# Patient Record
Sex: Female | Born: 1960 | Race: White | Hispanic: No | Marital: Married | Smoking: Former smoker
Health system: Southern US, Community
[De-identification: ages and names within clinical notes are randomized; demographics above are authoritative.]

## PROBLEM LIST (undated history)

## (undated) DIAGNOSIS — E1140 Type 2 diabetes mellitus with diabetic neuropathy, unspecified: Secondary | ICD-10-CM

## (undated) DIAGNOSIS — R112 Nausea with vomiting, unspecified: Secondary | ICD-10-CM

## (undated) DIAGNOSIS — R002 Palpitations: Secondary | ICD-10-CM

## (undated) DIAGNOSIS — R42 Dizziness and giddiness: Secondary | ICD-10-CM

## (undated) DIAGNOSIS — I10 Essential (primary) hypertension: Secondary | ICD-10-CM

## (undated) DIAGNOSIS — Z9889 Other specified postprocedural states: Secondary | ICD-10-CM

## (undated) DIAGNOSIS — E119 Type 2 diabetes mellitus without complications: Secondary | ICD-10-CM

## (undated) DIAGNOSIS — I809 Phlebitis and thrombophlebitis of unspecified site: Secondary | ICD-10-CM

## (undated) DIAGNOSIS — M545 Low back pain, unspecified: Secondary | ICD-10-CM

## (undated) HISTORY — DX: Type 2 diabetes mellitus without complications: E11.9

## (undated) HISTORY — PX: BLADDER SURGERY: SHX569

## (undated) HISTORY — PX: TONSILLECTOMY: SUR1361

## (undated) HISTORY — PX: TOTAL ABDOMINAL HYSTERECTOMY: SHX209

---

## 1993-08-02 HISTORY — PX: TUBAL LIGATION: SHX77

## 2004-05-20 ENCOUNTER — Emergency Department: Payer: Self-pay | Admitting: Emergency Medicine

## 2004-11-10 ENCOUNTER — Ambulatory Visit: Payer: Self-pay | Admitting: Obstetrics and Gynecology

## 2004-11-20 ENCOUNTER — Ambulatory Visit: Payer: Self-pay | Admitting: Family Medicine

## 2005-12-09 ENCOUNTER — Ambulatory Visit: Payer: Self-pay | Admitting: Obstetrics and Gynecology

## 2006-12-13 ENCOUNTER — Ambulatory Visit: Payer: Self-pay | Admitting: Obstetrics and Gynecology

## 2007-10-31 ENCOUNTER — Ambulatory Visit: Payer: Self-pay | Admitting: Obstetrics and Gynecology

## 2007-11-06 ENCOUNTER — Inpatient Hospital Stay: Payer: Self-pay | Admitting: Obstetrics and Gynecology

## 2007-11-14 ENCOUNTER — Ambulatory Visit: Payer: Self-pay | Admitting: Obstetrics and Gynecology

## 2007-12-18 ENCOUNTER — Ambulatory Visit: Payer: Self-pay | Admitting: Obstetrics and Gynecology

## 2008-12-19 ENCOUNTER — Ambulatory Visit: Payer: Self-pay | Admitting: Obstetrics and Gynecology

## 2009-12-22 ENCOUNTER — Ambulatory Visit: Payer: Self-pay | Admitting: Obstetrics and Gynecology

## 2010-07-01 ENCOUNTER — Ambulatory Visit: Payer: Self-pay | Admitting: Internal Medicine

## 2010-08-24 ENCOUNTER — Ambulatory Visit: Payer: Self-pay | Admitting: Urology

## 2010-12-25 ENCOUNTER — Ambulatory Visit: Payer: Self-pay | Admitting: Obstetrics and Gynecology

## 2011-03-26 ENCOUNTER — Ambulatory Visit: Payer: Self-pay | Admitting: Family Medicine

## 2011-12-28 ENCOUNTER — Ambulatory Visit: Payer: Self-pay | Admitting: Obstetrics and Gynecology

## 2012-04-11 ENCOUNTER — Ambulatory Visit: Payer: Self-pay | Admitting: Obstetrics and Gynecology

## 2012-04-11 LAB — URINALYSIS, COMPLETE
Bilirubin,UR: NEGATIVE
Ketone: NEGATIVE
Nitrite: NEGATIVE
Ph: 7 (ref 4.5–8.0)
Protein: NEGATIVE
RBC,UR: <1 /HPF (ref 0–5)
Specific Gravity: 1.012 (ref 1.003–1.030)
Squamous Epithelial: 2
WBC UR: <1 /HPF (ref 0–5)

## 2012-04-17 ENCOUNTER — Ambulatory Visit: Payer: Self-pay | Admitting: Urology

## 2013-05-10 ENCOUNTER — Encounter: Payer: Self-pay | Admitting: Podiatry

## 2013-05-14 ENCOUNTER — Ambulatory Visit (INDEPENDENT_AMBULATORY_CARE_PROVIDER_SITE_OTHER): Payer: BC Managed Care – PPO

## 2013-05-14 ENCOUNTER — Ambulatory Visit (INDEPENDENT_AMBULATORY_CARE_PROVIDER_SITE_OTHER): Payer: BC Managed Care – PPO | Admitting: Podiatry

## 2013-05-14 ENCOUNTER — Encounter: Payer: Self-pay | Admitting: Podiatry

## 2013-05-14 VITALS — BP 119/70 | HR 69 | Resp 16 | Ht 67.0 in | Wt 194.0 lb

## 2013-05-14 DIAGNOSIS — L02619 Cutaneous abscess of unspecified foot: Secondary | ICD-10-CM | POA: Insufficient documentation

## 2013-05-14 DIAGNOSIS — L97509 Non-pressure chronic ulcer of other part of unspecified foot with unspecified severity: Secondary | ICD-10-CM | POA: Insufficient documentation

## 2013-05-14 MED ORDER — MUPIROCIN 2 % EX OINT: TOPICAL_OINTMENT | Freq: Three times a day (TID) | CUTANEOUS | Status: DC

## 2013-05-14 MED ORDER — LEVOFLOXACIN 500 MG PO TABS: 500.0000 mg | ORAL_TABLET | Freq: Every day | ORAL | Status: DC

## 2013-05-14 NOTE — Progress Notes (Signed)
Cynthia King presents today with a painful sub-first metatarsophalangeal joint lesion. She is a known history of ulceration here. Denies fever chills nausea vomiting muscle aches pain. States that has been going on now for the past few weeks. She is eager to get back to walking. No change in her past medical history medications and allergies.  Objective: Pulses remain strongly palpable bilateral lower extremity. Slight decrease in sensorium per Semmes-Weinstein monofilament. She has a superficial ulceration plantar aspect of the right foot after debridement of reactive hyperkeratosis. Some mild erythema surrounding the area. There is some mild serosanguineous drainage. Radiographs taken today do not demonstrate any type of osseous abnormalities. However I remain concerned for for an acute osteomyelitis of the tibial sesamoid.  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 Ulceration less than 3 mm. Mild cellulitis. Questionable osteomyelitis.  Plan: She will continue her conservative therapies as far as soaking this and applying Acrobat ointment to it daily after soaking. She'll also dress the foot after soaking and application of the ointment. I am starting her on Levaquin 500 mg 1 by mouth daily. We'll followup with her in 2 weeks.

## 2013-05-28 ENCOUNTER — Ambulatory Visit (INDEPENDENT_AMBULATORY_CARE_PROVIDER_SITE_OTHER): Payer: BC Managed Care – PPO | Admitting: Podiatry

## 2013-05-28 ENCOUNTER — Encounter: Payer: Self-pay | Admitting: Podiatry

## 2013-05-28 VITALS — BP 121/78 | HR 79 | Temp 97.9°F | Resp 16 | Ht 67.0 in

## 2013-05-28 DIAGNOSIS — L97509 Non-pressure chronic ulcer of other part of unspecified foot with unspecified severity: Secondary | ICD-10-CM

## 2013-05-29 NOTE — Progress Notes (Signed)
Cynthia King presents today for followup of ulceration sub-first metatarsophalangeal joint of her right foot. She states it is doing great. She continues to placed padding and limit her activity. She denies fever chills nausea vomiting muscle aches or pains. States that her blood sugar is within normal limits.  Objective: Vital signs are stable she is alert and oriented x3 pulses are strongly palpable to the bilateral lower extremity. Reactive hyperkeratosis is present sub-first metatarsophalangeal joint. Once debrided reveals no signs of ulceration at this point. There is no signs of infection. I debrided all of the reactive hyperkeratosis to normal tissue.  Assessment: Well-healing ulcerative lesion plantar aspect right foot.  Plan: We discussed etiology pathology conservative versus surgical therapies we discussed padding and orthotics. Debrided her lesion today we'll followup with her in December for routine debridement.

## 2013-07-23 ENCOUNTER — Encounter: Payer: Self-pay | Admitting: Podiatry

## 2013-07-23 ENCOUNTER — Ambulatory Visit (INDEPENDENT_AMBULATORY_CARE_PROVIDER_SITE_OTHER): Payer: BC Managed Care – PPO | Admitting: Podiatry

## 2013-07-23 VITALS — BP 139/84 | HR 69 | Resp 16

## 2013-07-23 DIAGNOSIS — L97509 Non-pressure chronic ulcer of other part of unspecified foot with unspecified severity: Secondary | ICD-10-CM

## 2013-07-23 DIAGNOSIS — E1169 Type 2 diabetes mellitus with other specified complication: Secondary | ICD-10-CM

## 2013-07-23 NOTE — Progress Notes (Signed)
   Subjective:    Patient ID: Cynthia King, female    DOB: [birth year], 52 y.o.   MRN: [id number]  HPI Comments: " its better "     Review of Systems     Objective:   Physical Exam: Pulses remain strongly palpable right lower extremity. Reactive hyperkeratosis plantar aspect of the right foot previously a diabetic ulcer has resolved and was debrided today demonstrates no bleeding.        Assessment & Plan:  Assessment: Well-healing diabetic ulceration plantar aspect of the right first metatarsophalangeal joint.  Plan: Debridement reactive hyperkeratosis and ulceration plantar aspect of the right foot today followup with her in approximately 2 months.

## 2013-09-24 ENCOUNTER — Encounter: Payer: Self-pay | Admitting: Podiatry

## 2013-09-24 ENCOUNTER — Ambulatory Visit (INDEPENDENT_AMBULATORY_CARE_PROVIDER_SITE_OTHER): Payer: BC Managed Care – PPO | Admitting: Podiatry

## 2013-09-24 VITALS — BP 111/68 | HR 80 | Resp 16 | Ht 67.0 in | Wt 195.0 lb

## 2013-09-24 DIAGNOSIS — L97509 Non-pressure chronic ulcer of other part of unspecified foot with unspecified severity: Secondary | ICD-10-CM

## 2013-09-24 DIAGNOSIS — Q828 Other specified congenital malformations of skin: Secondary | ICD-10-CM

## 2013-09-24 DIAGNOSIS — E1169 Type 2 diabetes mellitus with other specified complication: Secondary | ICD-10-CM

## 2013-09-24 NOTE — Progress Notes (Signed)
Cynthia King presents today for followup of her ulcerations plantar aspect of the bilateral foot. She states that the right forefoot sub-first metatarsophalangeal joint is mildly tender.  Objective: Vital signs are stable she is alert and oriented x3. Pulses are palpable bilateral. Capillary fill time is immediate to toes one through 5 bilateral. Reactive hyperkeratosis sub-first metatarsophalangeal joint bilaterally and the IP joint of the hallux bilaterally. He reactive hyperkeratosis sub-first metatarsophalangeal joint of the right foot is moderately tender with no purulence noted. There is no erythema edema saline is drainage or odor.  Assessment: Diabetes mellitus with a history of diabetic ulceration and diabetic peripheral neuropathy. Superficial ulceration and reactive hyperkeratosis sub-first metatarsophalangeal joint and hallux interphalangeal joint bilateral.  Plan: Debridement of all reactive hyperkeratosis.

## 2013-11-13 ENCOUNTER — Ambulatory Visit (INDEPENDENT_AMBULATORY_CARE_PROVIDER_SITE_OTHER): Payer: BC Managed Care – PPO | Admitting: Podiatry

## 2013-11-13 VITALS — Resp 16 | Ht 67.0 in | Wt 200.0 lb

## 2013-11-13 DIAGNOSIS — L97509 Non-pressure chronic ulcer of other part of unspecified foot with unspecified severity: Secondary | ICD-10-CM

## 2013-11-14 NOTE — Progress Notes (Signed)
Subjective:     Patient ID: Cynthia King, female   DOB: [birth year], 53 y.o.   MRN: [id number]  HPI patient states I need this area trimmed underneath my right foot. Points to the right first metatarsal stating it has been thick and irritated   Review of Systems     Objective:   Physical Exam Neurovascular status unchanged from previous visit with keratotic tissue sub-first metatarsal right with slight superficial breakdown but no proximal edema erythema or drainage noted    Assessment:     Superficial ulceration with neuropathy as part of the problem for this patient    Plan:     Debridement of tissue did not no drainage or anything to culture flushed and clean the area applied Iodosorb and sterile dressing with padding reappoint for routine care and less any issues should occur

## 2013-11-27 ENCOUNTER — Ambulatory Visit: Payer: Self-pay | Admitting: Gastroenterology

## 2013-12-10 ENCOUNTER — Other Ambulatory Visit: Payer: Self-pay | Admitting: *Deleted

## 2013-12-10 ENCOUNTER — Ambulatory Visit (INDEPENDENT_AMBULATORY_CARE_PROVIDER_SITE_OTHER): Payer: BC Managed Care – PPO

## 2013-12-10 ENCOUNTER — Encounter: Payer: Self-pay | Admitting: Podiatry

## 2013-12-10 ENCOUNTER — Ambulatory Visit (INDEPENDENT_AMBULATORY_CARE_PROVIDER_SITE_OTHER): Payer: BC Managed Care – PPO | Admitting: Podiatry

## 2013-12-10 VITALS — BP 128/82 | HR 70 | Temp 98.6°F | Resp 16

## 2013-12-10 DIAGNOSIS — L97509 Non-pressure chronic ulcer of other part of unspecified foot with unspecified severity: Secondary | ICD-10-CM

## 2013-12-10 MED ORDER — AMOXICILLIN-POT CLAVULANATE 875-125 MG PO TABS: 1.0000 | ORAL_TABLET | Freq: Two times a day (BID) | ORAL | Status: DC

## 2013-12-10 NOTE — Progress Notes (Signed)
She presents today chief complaint of a painful left foot with some swelling and some irritation around her hallux left. She is diabetic and is concerned about the left hallux. She states it is become more painful and more swollen over the past few days.  Objective: Vital signs are stable she is alert and oriented x3 pulses are palpable bilateral foot. Reactive hyperkeratosis to the plantar aspect of the first metatarsophalangeal joint as well as the plantar aspect of the hallux left. There is a superficial ulceration with skin breakdown beneath the IP joint of the hallux left with overlying reactive hyperkeratosis that is painful palpation. Cellulitic process with mild odor from the toe. Radiographic evaluation does not demonstrate any type of osteomyelitis of the hallux left. There is no gas in the skin.  Assessment: Diabetic ulceration plantar aspect first metatarsophalangeal joint right and hallux left with cellulitis left hallux.  Plan: Discussed etiology pathology conservative versus surgical therapies. I sharply debrided both wounds today. Dressed the left great toe with Silvadene and a dry sterile compressive dressing. She will start soaking twice daily Epsom salts and water and apply Bactroban ointment dressing. She was dispensed a prescription for Augmentin 875 mg #20 one by mouth twice a day. She was also dispensed a Darco shoe and we will followup with her in 2 weeks.

## 2013-12-19 ENCOUNTER — Ambulatory Visit: Payer: BC Managed Care – PPO | Admitting: Podiatry

## 2013-12-27 ENCOUNTER — Ambulatory Visit (INDEPENDENT_AMBULATORY_CARE_PROVIDER_SITE_OTHER): Payer: BC Managed Care – PPO | Admitting: Podiatrist

## 2013-12-27 ENCOUNTER — Encounter: Payer: Self-pay | Admitting: Podiatrist

## 2013-12-27 VITALS — BP 122/76 | HR 68 | Resp 16

## 2013-12-27 DIAGNOSIS — L97509 Non-pressure chronic ulcer of other part of unspecified foot with unspecified severity: Secondary | ICD-10-CM

## 2013-12-27 DIAGNOSIS — E1169 Type 2 diabetes mellitus with other specified complication: Secondary | ICD-10-CM

## 2013-12-27 NOTE — Progress Notes (Signed)
Subjective: Patient presents today for followup of ulceration plantar aspect of the left hallux. She states she's been following all instructions and that the ulceration itself looks much improved. She has been wearing the Darco shoe as instructed. She denies any nausea, vomiting, local or systemic signs of infection.  She also relates that she has had problems with ulcerations in the past and she is neuropathic and has no sensation to the plantar aspect of both of her feet.  Objective: Vascular status shows palpable pedal pulses at 2/4 DP and PT bilateral. Neurological sensation continues to be diminished bilateral. The previous ulceration on the plantar aspect of the left hallux appears to be improved.. There is still some friable fragile and hemorrhagic tissue present. No redness, no swelling, no pus, no signs of infection are present.  Assessment: Healing ulceration left hallux  Plan: Recommended that she redressed the toe with a dry and protective dressing for one more week. She'll to wear the Darco shoe for one more week as well. She'll be seen back with Dr. Al Corpus for a recheck in the future and for continued debridement of the hyperkeratotic lesions. If she has any problems or concerns prior that visit she is instructed to call.

## 2014-01-11 ENCOUNTER — Ambulatory Visit (INDEPENDENT_AMBULATORY_CARE_PROVIDER_SITE_OTHER): Payer: BC Managed Care – PPO | Admitting: Podiatry

## 2014-01-11 ENCOUNTER — Ambulatory Visit (INDEPENDENT_AMBULATORY_CARE_PROVIDER_SITE_OTHER): Payer: BC Managed Care – PPO

## 2014-01-11 VITALS — BP 134/79 | HR 77 | Resp 16

## 2014-01-11 DIAGNOSIS — L02619 Cutaneous abscess of unspecified foot: Secondary | ICD-10-CM

## 2014-01-11 DIAGNOSIS — L03119 Cellulitis of unspecified part of limb: Principal | ICD-10-CM

## 2014-01-11 DIAGNOSIS — L97509 Non-pressure chronic ulcer of other part of unspecified foot with unspecified severity: Secondary | ICD-10-CM

## 2014-01-11 MED ORDER — AMOXICILLIN-POT CLAVULANATE 875-125 MG PO TABS: 1.0000 | ORAL_TABLET | Freq: Two times a day (BID) | ORAL | Status: DC

## 2014-01-11 NOTE — Progress Notes (Signed)
Subjective:     Patient ID: Cynthia King, female   DOB: 10/10/1960, 53 y.o.   MRN: [id number]  HPI patient presents with a keratotic lesion underneath the first metatarsal head right that she states has become painful in the last several days. She was on antibiotics but finished it about 3 weeks ago   Review of Systems     Objective:   Physical Exam Neurovascular system is intact with no occasions a systemic infection with normal temperature and no history of chills or fever. Patient is found to have a keratotic lesion underneath the right first metatarsal head that is thick with mild redness around it and pain when I pressed    Assessment:     Probable soft tissue infection right first metatarsal head secondary to keratotic tissue that buildup    Plan:     Debride the keratotic tissue and I found there to be a small amount of drainage which I opened and cultured. Advised on soaks applied pad and antibiotic solution and placed patient on Augmentin 875 twice a day. If she should develop any proximal redness pain indications of systemic infection she is to go straight to the emergency room and will be seen back for recheck in the next several weeks

## 2014-01-22 DIAGNOSIS — M79609 Pain in unspecified limb: Secondary | ICD-10-CM

## 2014-01-23 ENCOUNTER — Encounter: Payer: Self-pay | Admitting: Podiatry

## 2014-01-23 ENCOUNTER — Ambulatory Visit (INDEPENDENT_AMBULATORY_CARE_PROVIDER_SITE_OTHER): Payer: BC Managed Care – PPO | Admitting: Podiatry

## 2014-01-23 DIAGNOSIS — L97509 Non-pressure chronic ulcer of other part of unspecified foot with unspecified severity: Secondary | ICD-10-CM

## 2014-01-23 DIAGNOSIS — E1169 Type 2 diabetes mellitus with other specified complication: Secondary | ICD-10-CM

## 2014-01-23 NOTE — Progress Notes (Signed)
She presents today for followup of a chronic ulceration sub-first metatarsal head of the right foot. She states is becoming little more sore.  Objective: Pulses are palpable right foot. No erythema edema saline is drainage or odor reactive hyperkeratosis is positive sub-first metatarsophalangeal joint right foot. Otherwise the foot looks good.  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 Healed currently.  Plan: Debridement of wound today followup with me in 2 months.

## 2014-01-27 IMAGING — MG MAM DGTL SCRN MAM NO ORDER W/CAD
1 series · 4 of 4 positions shown · non-contrast
Comparison: none

REASON FOR EXAM: scr mammo no order
COMMENTS:

[R CC · right · 4 of 4 slices shown]
[im 1/4]
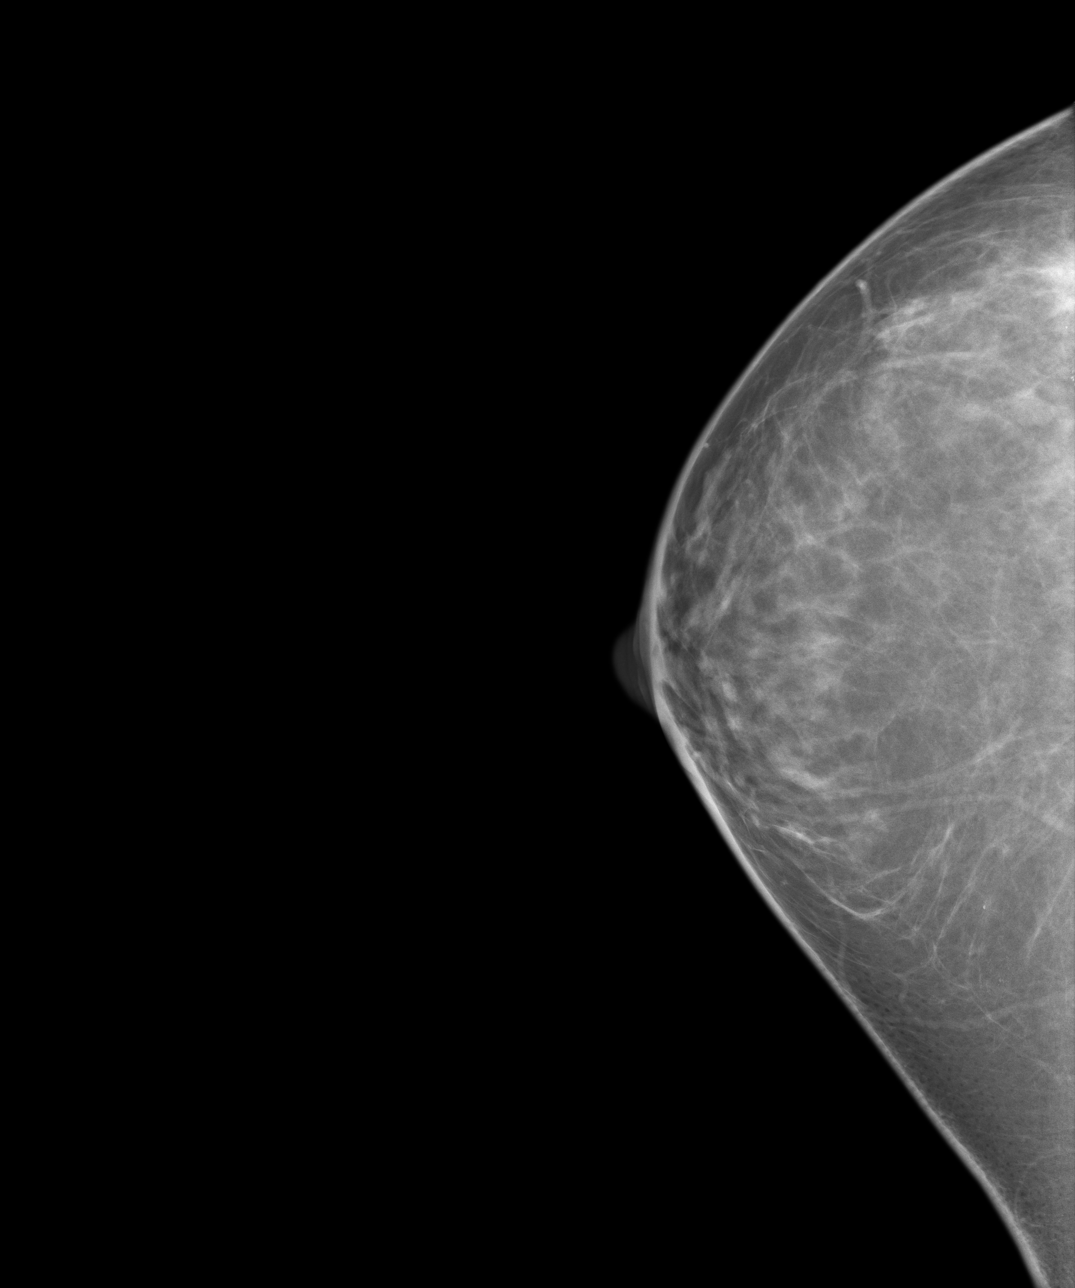
[im 2/4]
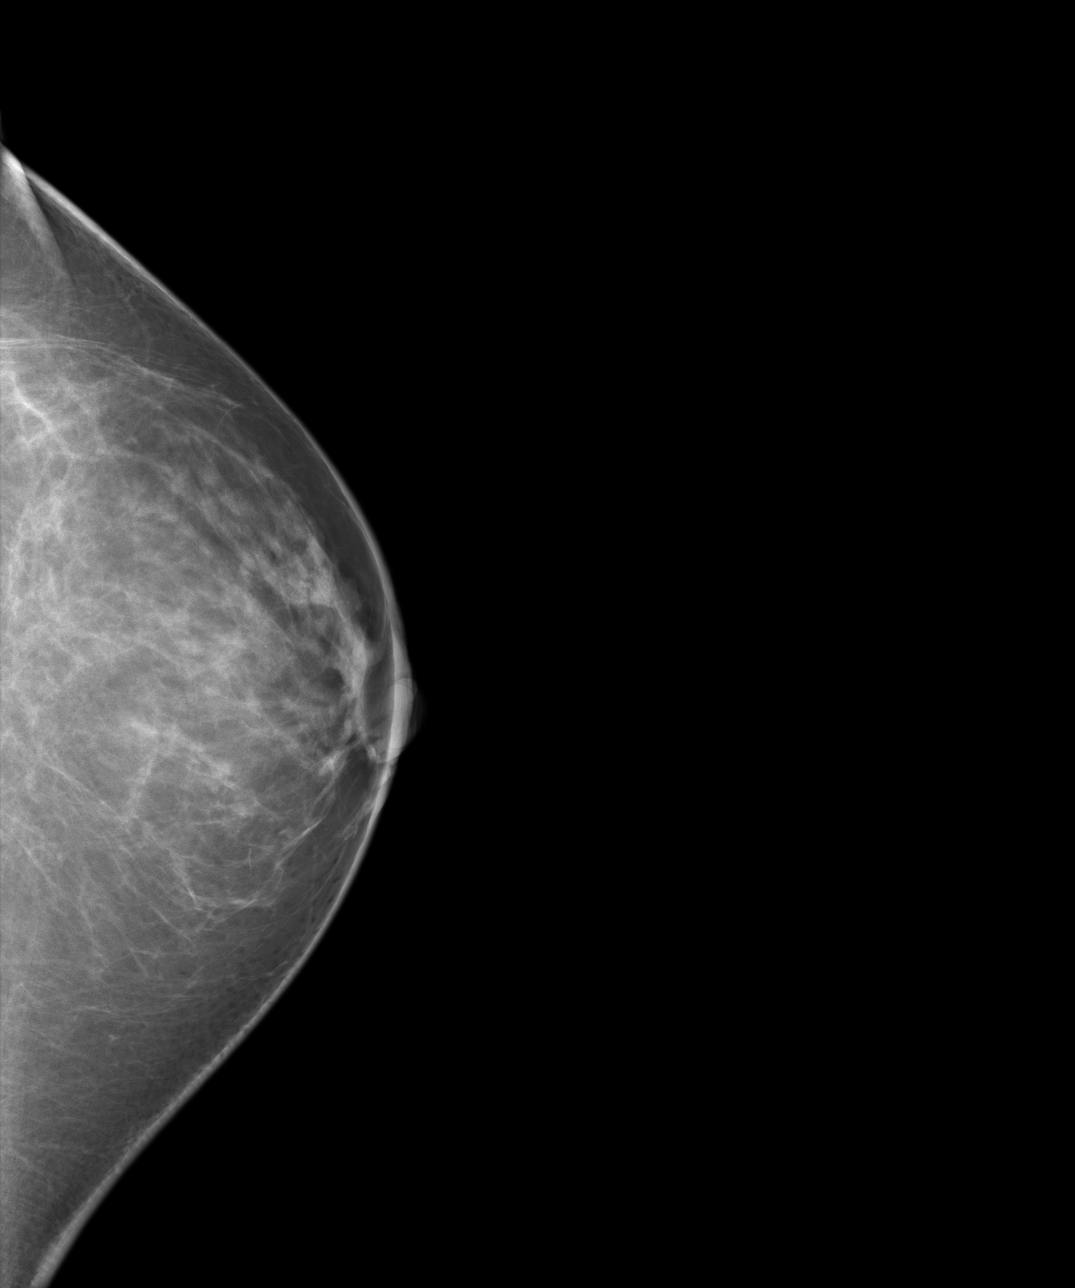
[im 3/4]
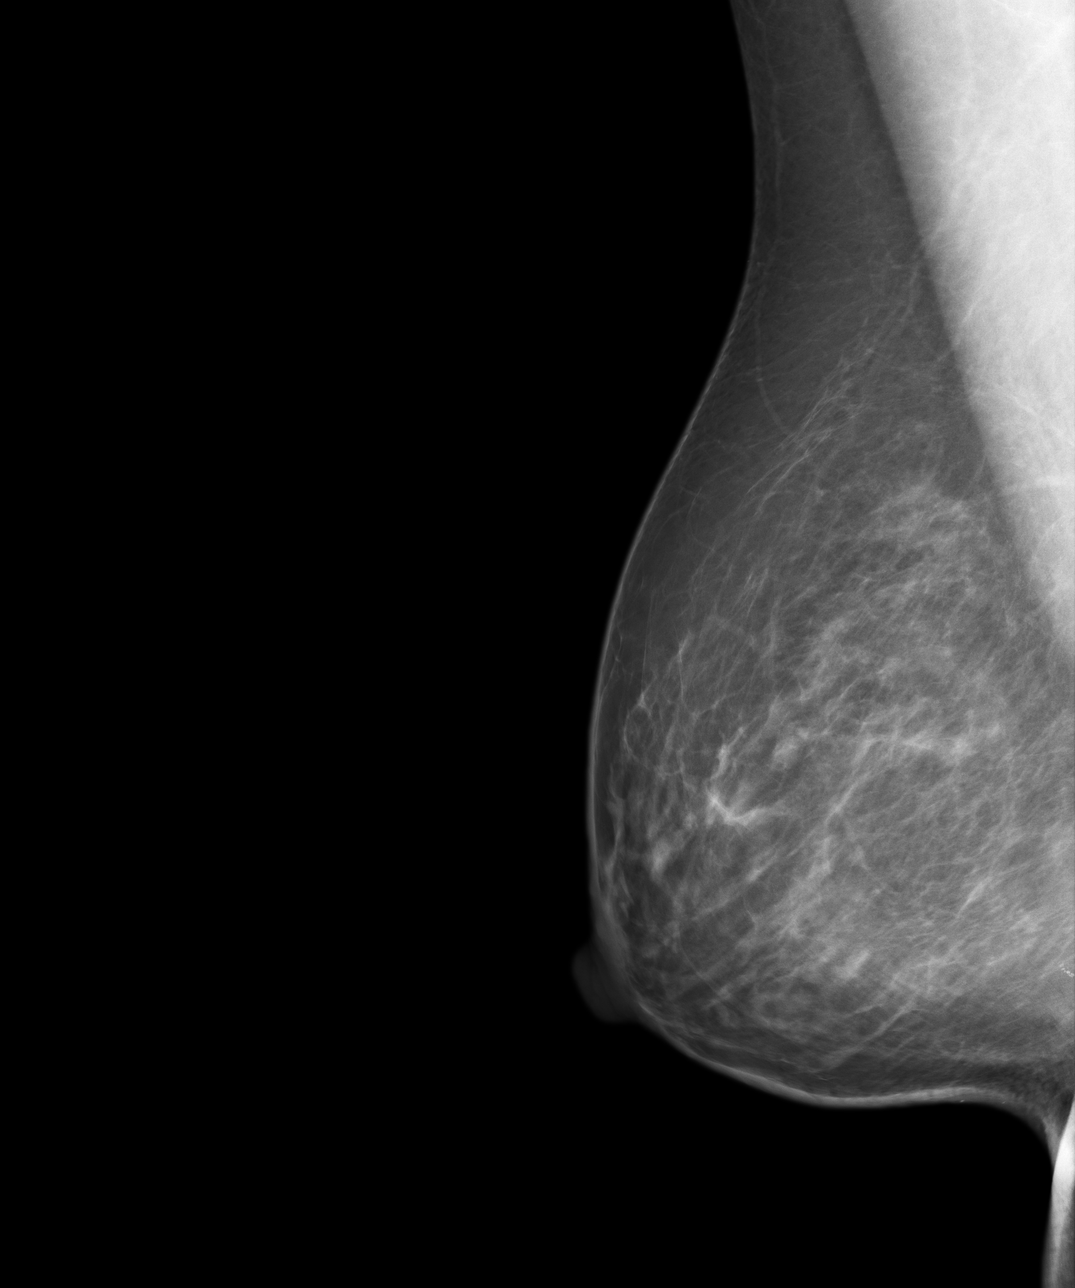
[im 4/4]
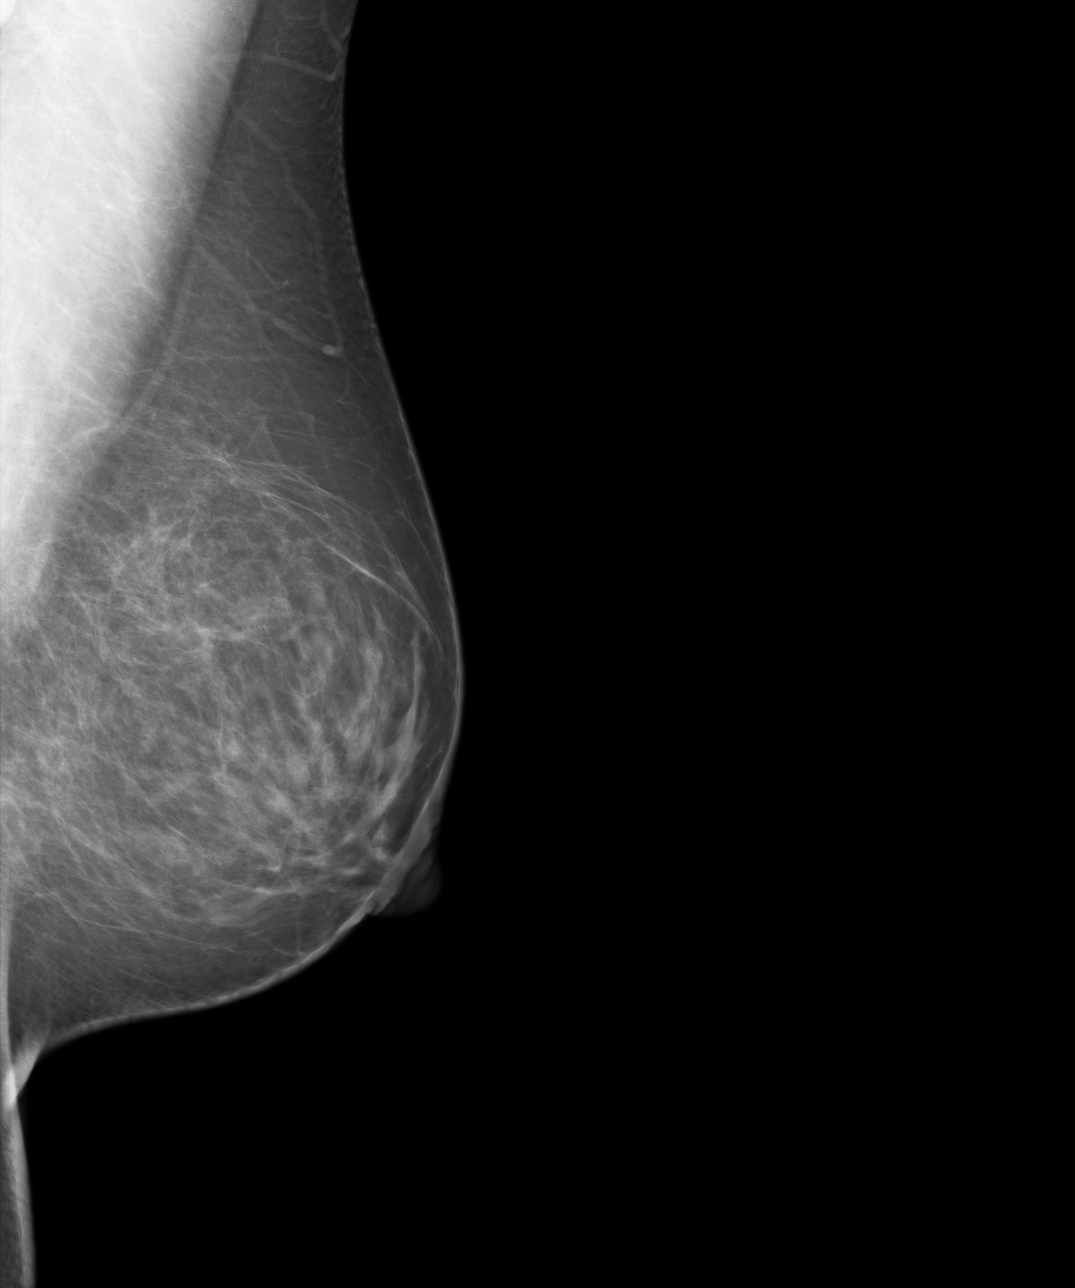

[4 of 4 positions shown; findings below may reference images not displayed]

PROCEDURE:     MAM - MAM DGTL SCRN MAM NO ORDER W/CAD  - December 28, 2011  [DATE]

RESULT:       Comparison is made to prior studies dated 12/25/2010 and
12/22/2009 and unilateral right mammogram dated 04/28/2001.

The breasts demonstrate a mild heterogeneous parenchymal pattern.  There is
no radiographic evidence to suggest malignancy.
IMPRESSION: BI-RADS: Category 2- Benign Finding.

Thank you for the opportunity to contribute to the care of your patient.

A NEGATIVE MAMMOGRAM REPORT DOES NOT PRECLUDE BIOPSY OR OTHER EVALUATION OF
A CLINICALLY PALPABLE OR OTHERWISE SUSPICIOUS MASS OR LESION.  BREAST CANCER
MAY NOT BE DETECTED BY MAMMOGRAPHY IN UP TO 10% OF CASES.

## 2014-03-27 ENCOUNTER — Ambulatory Visit: Payer: BC Managed Care – PPO | Admitting: Podiatry

## 2014-05-06 ENCOUNTER — Ambulatory Visit (INDEPENDENT_AMBULATORY_CARE_PROVIDER_SITE_OTHER): Payer: BC Managed Care – PPO | Admitting: Podiatry

## 2014-05-06 DIAGNOSIS — Q828 Other specified congenital malformations of skin: Secondary | ICD-10-CM

## 2014-05-06 DIAGNOSIS — L97519 Non-pressure chronic ulcer of other part of right foot with unspecified severity: Secondary | ICD-10-CM

## 2014-05-06 DIAGNOSIS — E10621 Type 1 diabetes mellitus with foot ulcer: Secondary | ICD-10-CM

## 2014-05-06 NOTE — Progress Notes (Signed)
She presents today for followup of her diabetic foot condition. She states that she seems to be doing quite well however of the ulceration is developed into a very painful callus.  Objective: Vital signs are stable she is alert and oriented x3. Pulses are palpable DP and PT bilateral. Reactive porokeratotic lesion plantar aspect right sub-first metatarsophalangeal joint. No erythema edema cellulitis drainage or odor. Reactive hyperkeratotic tissue IP joint of the hallux bilateral. No signs of ulceration or infection.  Assessment: Diabetes of porokeratotic lesion plantar aspect of the first metatarsophalangeal joint of the right foot.  Plan: Debridement reactive hypertrophic keratotic lesion today and I will followup with her as needed.

## 2014-06-19 ENCOUNTER — Ambulatory Visit: Payer: Self-pay | Admitting: Obstetrics and Gynecology

## 2014-07-15 ENCOUNTER — Ambulatory Visit (INDEPENDENT_AMBULATORY_CARE_PROVIDER_SITE_OTHER): Payer: BC Managed Care – PPO | Admitting: Podiatry

## 2014-07-15 ENCOUNTER — Ambulatory Visit (INDEPENDENT_AMBULATORY_CARE_PROVIDER_SITE_OTHER): Payer: BC Managed Care – PPO

## 2014-07-15 VITALS — BP 127/88 | HR 74 | Resp 16

## 2014-07-15 DIAGNOSIS — E10621 Type 1 diabetes mellitus with foot ulcer: Secondary | ICD-10-CM

## 2014-07-15 DIAGNOSIS — Q828 Other specified congenital malformations of skin: Secondary | ICD-10-CM

## 2014-07-15 DIAGNOSIS — M722 Plantar fascial fibromatosis: Secondary | ICD-10-CM

## 2014-07-15 DIAGNOSIS — L97519 Non-pressure chronic ulcer of other part of right foot with unspecified severity: Secondary | ICD-10-CM

## 2014-07-15 NOTE — Progress Notes (Signed)
She presents today with chief complaint of painful ulceration plantar aspect of the first metatarsophalangeal joint right foot. Painful right heel. She states that she likes having  Orthotics.  Objective: Vital signs are stable she is alert and oriented 3. Pain on palpation medially continued to the right heel. Radiographs do demonstrate soft tissue increase in density on a fascial insertion site. Upon debridement of reactive hyperkeratosis of first metatarsophalangeal joint of the right foot demonstrates a painful non-infected ulceration right first metatarsophalangeal joint.  Assessment: Diabetes with a history of diabetic ulceration right. Plantar fasciitis.  Plan: Injected the right heel today with Kenalog and local anesthetic. She was also scanned for set of orthotics. I also dispensed a metatarsal brace and night splint. I will follow up with her in the near future.

## 2014-07-17 ENCOUNTER — Ambulatory Visit: Payer: BC Managed Care – PPO | Admitting: Podiatry

## 2014-08-05 ENCOUNTER — Ambulatory Visit: Payer: BC Managed Care – PPO | Admitting: Podiatry

## 2014-08-16 ENCOUNTER — Encounter: Payer: Self-pay | Admitting: Podiatry

## 2014-09-16 ENCOUNTER — Ambulatory Visit: Payer: BC Managed Care – PPO | Admitting: Podiatry

## 2014-09-23 ENCOUNTER — Ambulatory Visit (INDEPENDENT_AMBULATORY_CARE_PROVIDER_SITE_OTHER): Payer: No Typology Code available for payment source | Admitting: Podiatry

## 2014-09-23 ENCOUNTER — Encounter: Payer: Self-pay | Admitting: Podiatry

## 2014-09-23 VITALS — BP 128/90 | HR 78 | Resp 12

## 2014-09-23 DIAGNOSIS — L97519 Non-pressure chronic ulcer of other part of right foot with unspecified severity: Secondary | ICD-10-CM

## 2014-09-23 DIAGNOSIS — L89891 Pressure ulcer of other site, stage 1: Secondary | ICD-10-CM

## 2014-09-23 DIAGNOSIS — E10621 Type 1 diabetes mellitus with foot ulcer: Secondary | ICD-10-CM

## 2014-09-23 DIAGNOSIS — M722 Plantar fascial fibromatosis: Secondary | ICD-10-CM

## 2014-09-23 NOTE — Progress Notes (Signed)
She presents today for follow-up of diabetic ulcerations of first metatarsophalangeal joint of the right foot and for plantar fasciitis of the left heel. She states that the ulcer has been mildly tender in the plantar fasciitis has resolved in total.  Objective: Vital signs are stable alert and oriented 3. Pulses are palpable bilaterally. Reactive hyperkeratotic callous and ulcerative breakdown sub-first metatarsophalangeal joint of the right foot. This does not demonstrates signs of infection at this point. No pain on palpation left heel.  Assessment: Diabetic ulceration sub-first metatarsophalangeal joint right foot. Resolved plantar fasciitis left.  Plan: Debrided reactive hyperkeratosis today instructing patient on soaking instructions and application of antibiotic ointment discussed offloading and appropriate shoe gear. I also suggested she follow up with me in a couple of weeks for reevaluation.

## 2014-11-19 NOTE — Op Note (Signed)
PATIENT NAME:  Cynthia King, Cynthia King MR#:  [id number] DATE OF BIRTH:  18-Aug-1960  DATE OF PROCEDURE:  04/17/2012  PREOPERATIVE DIAGNOSES:  1. History of endometriosis.  2. Status post laparoscopic supracervical hysterectomy.  3. Recurrent bladder endometriosis, managed for Dr. Evelene CroonWolff.  4. [facility].  5. Desires definitive management of endometriosis.   POSTOPERATIVE DIAGNOSES: 1. History of endometriosis.  2. Status post laparoscopic supracervical hysterectomy.  3. Recurrent bladder endometriosis, managed for Dr. Evelene CroonWolff.  [facility] 5. Extensive adhesions.   PROCEDURES:  1. Extensive adhesiolysis. 2. Bilateral salpingo-oophorectomy. 3. Cauterization of upper endocervix.   SURGEON: Ricky King. Logan BoresEvans, MD    ANESTHESIA: General endotracheal.   ASSISTANT: Scrub tech   FINDINGS: Small bowel and fat adhesions from left pelvic brim down along the left pelvic sidewalls and including the cervical stump. Excellent hemostasis, cosmesis.   SPECIMENS: Bilateral tubes and ovaries.   DRAINS: In and out catheter with a red rubber at the beginning of the case, approximately 300 mL of clear urine.   COMPLICATIONS: None.   ESTIMATED BLOOD LOSS: 75.   PROCEDURE IN DETAIL: The patient was consented. Preoperative antibiotics were given. The patient was taken to the operating room and placed in the supine position where anesthesia was initiated, prepped and draped in the usual sterile fashion, and placed in the dorsal lithotomy position using Allen stirrups. Cervix was visualized. Sponge clamp was placed and bladder was drained. Entry was made into the abdomen.   An 11 port was placed infraumbilically under direct visualization and an 11 was placed in the left lower quadrant and a 5 in the right lower quadrant with findings as noted above.   Over the next hour I tediously dissected free by blunt dissection and also with the Harmonic scalpel the fat pad and the  small bowel until we were able to visualize the left tube and ovary. Once these were identified, they were elevated and Kleppingers were used to come across the IP and these were then dissected free. The right tube and ovary was fairly free and again was elevated. IP was cauterized with Kleppingers and was divided with the Harmonic scalpel. Both tubes and ovaries were placed in an Endopouch and these were delivered and sent for permanent specimen.   There was again extensive adhesions along the pelvic floor including the cervical stump. These again were dissected free using blunt dissection and Harmonic scalpel taking care to stay away from the bowel itself. We were able to visualize peritoneum covering the cervical stump, unable to localize the internal cervical os.   I went below, speculum was reinserted. Hulka tenaculum was placed after finding a depth of approximately 2.5 cm of the cervical canal. Hulka was then inserted to dissect down to the intracervical portion of the Hulka. Unable to identify and then sound was placed bluntly and using palpation we entered the cervical os and was then able to locate the internal cervical os. This was then backed off approximately a centimeter and a half and this portion of the cervix was cauterized. I then removed both clamps and hand of the operator was placed in the vagina to ensure no upper vaginal entry and the rest of the cervical stump was cauterized with Kleppingers as well.   There was some persistent oozing from the left portion of the cervix which was attempted to be cauterized with the Harmonic and then light Kleppinger, then attempt using gallbladder clips to achieve hemostasis was unable to control,  therefore Kleppingers were then used to cauterize. It did appear that the ureter was out of the operative area. Areas were copiously irrigated. Pressure was lowered to 6 mmHg. Arteries were seen to be hemostatic. The left port was removed and noted to be some  bleeding from the peritoneal surface of the port insertion site and, while finger of operator was placed into the incision, Kleppingers were brought over from the right port and peritoneal portion of the incision was cauterized to hemostasis. All ports were removed and pneumoperitoneum was allowed to resolve. Incisions were closed with deep of 0, subcutaneous with 3-0 Vicryl. Steri-Strips and Band-Aids were placed.   The patient tolerated the procedure well. She was left in the dorsal lithotomy position for Dr. Evelene Croon to come in and perform his portion of the procedure which will be dictated separately.   Again, it was at least an hour additional involved with the extensive adhesiolysis.   ____________________________ Reatha Harps. Logan Bores, MD rle:drc D: 04/17/2012 10:48:20 ET T: 04/17/2012 11:04:14 ET JOB#: [id number]  cc: Ricky King. Logan Bores, MD, <Dictator> Augustina Mood MD ELECTRONICALLY SIGNED 04/17/2012 14:03

## 2014-11-19 NOTE — Op Note (Signed)
PATIENT NAME:  Cynthia SaugerFAUCETTE, [name] MR#:  [id number] DATE OF BIRTH:  15-Oct-1960  DATE OF PROCEDURE:  04/17/2012  PREOPERATIVE DIAGNOSIS: Endometriosis of the bladder.   POSTOPERATIVE DIAGNOSIS: Endometriosis of the bladder.   PROCEDURE: Transurethral resection of bladder tumor.   SURGEON: Anola GurneyMichael Wolff, MD  ANESTHETIST: Randall AnGjibertus Van Staveren, MD  ANESTHESIA: General.   INDICATIONS: See the dictated history and physical. After informed consent, the patient requests the above procedure.   OPERATIVE SUMMARY: The patient was already under general anesthesia and Dr. Logan BoresEvans had completed his surgical procedure. At this point, the patient was placed into the dorsal lithotomy position and the perineum was prepped and draped in the usual fashion. The resectoscope sheath was then advanced into the bladder with the obturator in place. The resectoscope was then coupled to the camera and then placed into the sheath. The bladder was thoroughly inspected. Both ureteral orifices were identified and had clear efflux. The patient had a 5 mm papillary lesion along the left lateral bladder wall. No other lesions were identified. At this point, the lesion was resected and fragments submitted to pathology. The base of the lesion was fulgurated. The bladder was drained and the scope was removed. 10 mL of viscous Xylocaine was instilled within the urethra and the bladder. The procedure was then terminated and the patient was transferred to the recovery room in stable condition.  ____________________________ Suszanne ConnersMichael R. Evelene CroonWolff, MD [email or web address]:slb D: 04/17/2012 09:38:01 ET    T: 04/17/2012 09:43:42 ET        JOB#: [id number] cc: Suszanne ConnersMichael R. Evelene CroonWolff, MD, <Dictator> Orson ApeMICHAEL R WOLFF MD ELECTRONICALLY SIGNED 04/17/2012 16:53

## 2014-11-19 NOTE — H&P (Signed)
PATIENT NAME:  Cynthia King, Cynthia King MR#:  [id number] DATE OF BIRTH:  Jul 07, 1961  DATE OF ADMISSION:  04/17/2012  CHIEF COMPLAINT: Pelvic pain.   HISTORY OF PRESENT ILLNESS: Cynthia King is a 54 year old white female with a six-month history of intermittent left low pelvic pain. She has a history of endometriosis involving the bladder and underwent removal of a bladder lesion, which was endometriosis, back in January of 2012. She underwent cystoscopy in the office on August 6th and had a recurrent lesion in the mid posterior wall of the bladder measuring 5 mm in size consistent with recurrent endometriosis of the bladder. She comes in now for removal of this lesion.   ALLERGIES: Advil.   CURRENT MEDICATIONS: Currently, the patient was not taking any medication.   PAST SURGICAL HISTORY:  1. C-sections. 2. Vaginal hysterectomy.  3. Tonsillectomy.  4. Adenoidectomy.  5. Tubal ligation.  6. Toe surgery.   SOCIAL HISTORY: She denied tobacco use. She denied alcohol use.   FAMILY HISTORY: Remarkable for asthma, hypertension, diabetes, depression, and hypercholesterolemia.   REVIEW OF SYSTEMS: The patient denies chest pain or shortness of breath.   PHYSICAL EXAMINATION:  GENERAL: A well-nourished white female in no distress.   HEENT: Sclerae were clear. Pupils were equally round and reactive to light and accommodation. Extraocular movements were intact.  NECK: Supple. No palpable cervical adenopathy.   LUNGS: Clear to auscultation.   CARDIOVASCULAR: Regular rhythm and rate without audible murmurs.   ABDOMEN: Soft, nontender abdomen.   GENITOURINARY: Normal external genitalia without vaginal discharge.   NEUROMUSCULAR: Nonfocal.   IMPRESSION: Endometriosis of the bladder.   PLAN: Cystoscopy with laser removal and resection of bladder lesion.  ____________________________ Suszanne ConnersMichael R. Evelene CroonWolff, MD [id number]:[id number] D: 04/10/2012 16:38:19 ET T: 04/10/2012 17:47:46 ET JOB#: [id number]  cc: Suszanne ConnersMichael R.  Evelene CroonWolff, MD, <Dictator> Orson ApeMICHAEL R WOLFF MD ELECTRONICALLY SIGNED 04/11/2012 8:22

## 2014-11-25 ENCOUNTER — Ambulatory Visit (INDEPENDENT_AMBULATORY_CARE_PROVIDER_SITE_OTHER): Payer: No Typology Code available for payment source | Admitting: Podiatry

## 2014-11-25 ENCOUNTER — Encounter: Payer: Self-pay | Admitting: Podiatry

## 2014-11-25 VITALS — Ht 67.0 in | Wt 196.0 lb

## 2014-11-25 DIAGNOSIS — L89891 Pressure ulcer of other site, stage 1: Secondary | ICD-10-CM | POA: Diagnosis not present

## 2014-11-25 DIAGNOSIS — L97519 Non-pressure chronic ulcer of other part of right foot with unspecified severity: Principal | ICD-10-CM

## 2014-11-25 DIAGNOSIS — E10621 Type 1 diabetes mellitus with foot ulcer: Secondary | ICD-10-CM

## 2014-11-25 NOTE — Progress Notes (Signed)
She presents today follow-up of her plantar fasciitis as well as a diabetic ulcer to the plantar aspect of her right foot. She states that the plantar fasciitis is doing great in that she has no problems with the right foot as of yet. 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  Objective: Pulses are strongly palpable bilaterally. She has no pain on palpation medial calcaneal tubercle bilateral. She has a reactive hyperkeratosis of the first metatarsophalangeal joint of the right foot was debrided to bleeding does demonstrate well-healed ulceration. No signs of infection.  Assessment: Well-healing diabetic ulceration sub-first metatarsophalangeal joint right foot. Well-healed plantar fasciitis.  Plan: Debridement of the wound today. Continue all conservative therapies follow up with me in 1 month's and continue to maintain her diabetes the best she can.

## 2015-01-27 ENCOUNTER — Ambulatory Visit (INDEPENDENT_AMBULATORY_CARE_PROVIDER_SITE_OTHER): Payer: No Typology Code available for payment source | Admitting: Podiatry

## 2015-01-27 ENCOUNTER — Encounter: Payer: Self-pay | Admitting: Podiatry

## 2015-01-27 VITALS — BP 140/81 | HR 74 | Resp 16

## 2015-01-27 DIAGNOSIS — L97519 Non-pressure chronic ulcer of other part of right foot with unspecified severity: Principal | ICD-10-CM

## 2015-01-27 DIAGNOSIS — E10621 Type 1 diabetes mellitus with foot ulcer: Secondary | ICD-10-CM | POA: Diagnosis not present

## 2015-01-27 DIAGNOSIS — L89891 Pressure ulcer of other site, stage 1: Secondary | ICD-10-CM

## 2015-01-27 NOTE — Progress Notes (Signed)
She presents today follow-up of her diabetic ulcer to the plantar aspect of her right foot. She states that it is a little sore today.  Objective: Pulses are strongly palpable bilaterally. She has a reactive hyperkeratosis of the first metatarsophalangeal joint of the right foot was debrided does demonstrate well-healed ulceration. No signs of infection.  Assessment: Well-healing diabetic ulceration sub-first metatarsophalangeal joint right foot.  Plan: Debridement of the hyperkeratosis today. Continue all conservative therapies follow up with me in 2 months and continue to maintain her diabetes.

## 2015-03-17 ENCOUNTER — Telehealth: Payer: Self-pay | Admitting: Podiatry

## 2015-03-17 NOTE — Telephone Encounter (Signed)
PT CALLED AND WANTS TO SET UP A PAYMENT PLAN.PLEASE CALL HER BACK TO SET THIS UP.

## 2015-03-31 ENCOUNTER — Ambulatory Visit: Payer: No Typology Code available for payment source | Admitting: Podiatry

## 2015-06-17 ENCOUNTER — Other Ambulatory Visit: Payer: Self-pay | Admitting: Obstetrics and Gynecology

## 2015-06-17 DIAGNOSIS — Z1231 Encounter for screening mammogram for malignant neoplasm of breast: Secondary | ICD-10-CM

## 2015-06-24 ENCOUNTER — Ambulatory Visit
Admission: RE | Admit: 2015-06-24 | Discharge: 2015-06-24 | Disposition: A | Discharge status: Home / Self Care | Payer: No Typology Code available for payment source | Source: Ambulatory Visit | Attending: Obstetrics and Gynecology | Admitting: Obstetrics and Gynecology

## 2015-06-24 DIAGNOSIS — Z1231 Encounter for screening mammogram for malignant neoplasm of breast: Secondary | ICD-10-CM | POA: Insufficient documentation

## 2016-06-18 ENCOUNTER — Other Ambulatory Visit: Payer: Self-pay | Admitting: Obstetrics and Gynecology

## 2016-06-18 DIAGNOSIS — Z1231 Encounter for screening mammogram for malignant neoplasm of breast: Secondary | ICD-10-CM

## 2016-07-01 ENCOUNTER — Ambulatory Visit
Admission: RE | Admit: 2016-07-01 | Discharge: 2016-07-01 | Disposition: A | Discharge status: Home / Self Care | Payer: BLUE CROSS/BLUE SHIELD | Source: Ambulatory Visit | Attending: Obstetrics and Gynecology | Admitting: Obstetrics and Gynecology

## 2016-07-01 DIAGNOSIS — Z1231 Encounter for screening mammogram for malignant neoplasm of breast: Secondary | ICD-10-CM | POA: Insufficient documentation

## 2017-05-04 ENCOUNTER — Encounter: Payer: Self-pay | Admitting: *Deleted

## 2017-05-09 ENCOUNTER — Other Ambulatory Visit: Payer: Self-pay | Admitting: Podiatry

## 2017-05-11 ENCOUNTER — Ambulatory Visit: Payer: BLUE CROSS/BLUE SHIELD | Admitting: Anesthesiology

## 2017-05-11 ENCOUNTER — Encounter
Admission: RE | Disposition: A | Discharge status: Home / Self Care | Payer: Self-pay | Source: Ambulatory Visit | Attending: Podiatry

## 2017-05-11 ENCOUNTER — Ambulatory Visit
Admission: RE | Admit: 2017-05-11 | Discharge: 2017-05-11 | Disposition: A | Discharge status: Home / Self Care | Payer: BLUE CROSS/BLUE SHIELD | Source: Ambulatory Visit | Attending: Podiatry | Admitting: Podiatry

## 2017-05-11 DIAGNOSIS — Z87891 Personal history of nicotine dependence: Secondary | ICD-10-CM | POA: Diagnosis not present

## 2017-05-11 DIAGNOSIS — J309 Allergic rhinitis, unspecified: Secondary | ICD-10-CM | POA: Insufficient documentation

## 2017-05-11 DIAGNOSIS — Z794 Long term (current) use of insulin: Secondary | ICD-10-CM | POA: Insufficient documentation

## 2017-05-11 DIAGNOSIS — I10 Essential (primary) hypertension: Secondary | ICD-10-CM | POA: Diagnosis not present

## 2017-05-11 DIAGNOSIS — L97519 Non-pressure chronic ulcer of other part of right foot with unspecified severity: Secondary | ICD-10-CM | POA: Diagnosis present

## 2017-05-11 DIAGNOSIS — E669 Obesity, unspecified: Secondary | ICD-10-CM | POA: Diagnosis not present

## 2017-05-11 DIAGNOSIS — L97511 Non-pressure chronic ulcer of other part of right foot limited to breakdown of skin: Secondary | ICD-10-CM | POA: Insufficient documentation

## 2017-05-11 DIAGNOSIS — E781 Pure hyperglyceridemia: Secondary | ICD-10-CM | POA: Insufficient documentation

## 2017-05-11 DIAGNOSIS — E1142 Type 2 diabetes mellitus with diabetic polyneuropathy: Secondary | ICD-10-CM | POA: Diagnosis not present

## 2017-05-11 DIAGNOSIS — E559 Vitamin D deficiency, unspecified: Secondary | ICD-10-CM | POA: Diagnosis not present

## 2017-05-11 DIAGNOSIS — Z886 Allergy status to analgesic agent status: Secondary | ICD-10-CM | POA: Insufficient documentation

## 2017-05-11 DIAGNOSIS — Z7984 Long term (current) use of oral hypoglycemic drugs: Secondary | ICD-10-CM | POA: Insufficient documentation

## 2017-05-11 DIAGNOSIS — Z79899 Other long term (current) drug therapy: Secondary | ICD-10-CM | POA: Diagnosis not present

## 2017-05-11 DIAGNOSIS — Z90710 Acquired absence of both cervix and uterus: Secondary | ICD-10-CM | POA: Insufficient documentation

## 2017-05-11 DIAGNOSIS — Q660 Congenital talipes equinovarus: Secondary | ICD-10-CM | POA: Diagnosis not present

## 2017-05-11 DIAGNOSIS — Z6830 Body mass index (BMI) 30.0-30.9, adult: Secondary | ICD-10-CM | POA: Insufficient documentation

## 2017-05-11 HISTORY — DX: Other specified postprocedural states: Z98.890

## 2017-05-11 HISTORY — DX: Dizziness and giddiness: R42

## 2017-05-11 HISTORY — PX: GASTROC RECESSION EXTREMITY: SHX6262

## 2017-05-11 HISTORY — PX: METATARSAL OSTEOTOMY: SHX1641

## 2017-05-11 HISTORY — DX: Type 2 diabetes mellitus with diabetic neuropathy, unspecified: E11.40

## 2017-05-11 HISTORY — DX: Palpitations: R00.2

## 2017-05-11 HISTORY — DX: Phlebitis and thrombophlebitis of unspecified site: I80.9

## 2017-05-11 HISTORY — DX: Nausea with vomiting, unspecified: R11.2

## 2017-05-11 HISTORY — DX: Low back pain: M54.5

## 2017-05-11 HISTORY — DX: Essential (primary) hypertension: I10

## 2017-05-11 HISTORY — DX: Low back pain, unspecified: M54.50

## 2017-05-11 LAB — GLUCOSE, CAPILLARY
Glucose-Capillary: 116 mg/dL — ABNORMAL HIGH (ref 65–99)
Glucose-Capillary: 151 mg/dL — ABNORMAL HIGH (ref 65–99)

## 2017-05-11 SURGERY — OSTEOTOMY, METATARSAL BONE
Anesthesia: General | Laterality: Right | Wound class: Clean

## 2017-05-11 MED ORDER — ONDANSETRON HCL 4 MG/2ML IJ SOLN: 4.0000 mg | Freq: Four times a day (QID) | INTRAMUSCULAR | Status: DC | PRN

## 2017-05-11 MED ORDER — BUPIVACAINE LIPOSOME 1.3 % IJ SUSP
INTRAMUSCULAR | Status: DC | PRN
  Administered 2017-05-11: 10 mL

## 2017-05-11 MED ORDER — OXYCODONE HCL 5 MG PO TABS: 5.0000 mg | ORAL_TABLET | Freq: Once | ORAL | Status: DC | PRN

## 2017-05-11 MED ORDER — ONDANSETRON HCL 4 MG PO TABS: 4.0000 mg | ORAL_TABLET | Freq: Four times a day (QID) | ORAL | Status: DC | PRN

## 2017-05-11 MED ORDER — LIDOCAINE HCL (CARDIAC) 20 MG/ML IV SOLN
INTRAVENOUS | Status: DC | PRN
  Administered 2017-05-11: 50 mg via INTRAVENOUS
  Administered 2017-05-11: 120 mg via INTRAVENOUS

## 2017-05-11 MED ORDER — POVIDONE-IODINE 7.5 % EX SOLN: Freq: Once | CUTANEOUS | Status: DC

## 2017-05-11 MED ORDER — OXYCODONE HCL 5 MG/5ML PO SOLN: 5.0000 mg | Freq: Once | ORAL | Status: DC | PRN

## 2017-05-11 MED ORDER — EPHEDRINE SULFATE 50 MG/ML IJ SOLN
INTRAMUSCULAR | Status: DC | PRN
  Administered 2017-05-11 (×3): 5 mg via INTRAVENOUS

## 2017-05-11 MED ORDER — BUPIVACAINE HCL (PF) 0.25 % IJ SOLN
INTRAMUSCULAR | Status: DC | PRN
  Administered 2017-05-11: 10 mL
  Administered 2017-05-11: 5 mL

## 2017-05-11 MED ORDER — LIDOCAINE-EPINEPHRINE (PF) 1 %-1:200000 IJ SOLN
INTRAMUSCULAR | Status: DC | PRN
  Administered 2017-05-11: 5 mL

## 2017-05-11 MED ORDER — FENTANYL CITRATE (PF) 100 MCG/2ML IJ SOLN
50.0000 ug | INTRAMUSCULAR | Status: DC | PRN
  Administered 2017-05-11: 100 ug via INTRAVENOUS

## 2017-05-11 MED ORDER — PROPOFOL 500 MG/50ML IV EMUL
INTRAVENOUS | Status: DC | PRN
  Administered 2017-05-11: 100 ug/kg/min via INTRAVENOUS

## 2017-05-11 MED ORDER — DEXAMETHASONE SODIUM PHOSPHATE 4 MG/ML IJ SOLN
INTRAMUSCULAR | Status: DC | PRN
  Administered 2017-05-11: 4 mg via INTRAVENOUS

## 2017-05-11 MED ORDER — CEFAZOLIN SODIUM-DEXTROSE 2-4 GM/100ML-% IV SOLN
2.0000 g | INTRAVENOUS | Status: AC
  Administered 2017-05-11: 2 g via INTRAVENOUS

## 2017-05-11 MED ORDER — MIDAZOLAM HCL 2 MG/2ML IJ SOLN
2.0000 mg | Freq: Once | INTRAMUSCULAR | Status: AC
  Administered 2017-05-11: 2 mg via INTRAVENOUS

## 2017-05-11 MED ORDER — OXYCODONE-ACETAMINOPHEN 5-325 MG PO TABS: 1.0000 | ORAL_TABLET | ORAL | Status: DC | PRN

## 2017-05-11 MED ORDER — ONDANSETRON HCL 4 MG/2ML IJ SOLN
INTRAMUSCULAR | Status: DC | PRN
  Administered 2017-05-11: 4 mg via INTRAVENOUS

## 2017-05-11 MED ORDER — LACTATED RINGERS IV SOLN
1000.0000 mL | INTRAVENOUS | Status: DC
  Administered 2017-05-11: 1000 mL via INTRAVENOUS

## 2017-05-11 MED ORDER — FENTANYL CITRATE (PF) 100 MCG/2ML IJ SOLN: 25.0000 ug | INTRAMUSCULAR | Status: DC | PRN

## 2017-05-11 SURGICAL SUPPLY — 47 items
16mm arcus staple ×2 IMPLANT
BANDAGE ELASTIC 4 VELCRO NS (GAUZE/BANDAGES/DRESSINGS) ×2 IMPLANT
BENZOIN TINCTURE PRP APPL 2/3 (GAUZE/BANDAGES/DRESSINGS) ×4 IMPLANT
BLADE MED AGGRESSIVE (BLADE) ×2 IMPLANT
BLADE OSC/SAGITTAL MD 5.5X18 (BLADE) ×2 IMPLANT
BLADE SURG 15 STRL LF DISP TIS (BLADE) ×1 IMPLANT
BLADE SURG 15 STRL SS (BLADE) ×1
BNDG COHESIVE 4X5 TAN STRL (GAUZE/BANDAGES/DRESSINGS) ×2 IMPLANT
BNDG ESMARK 4X12 TAN STRL LF (GAUZE/BANDAGES/DRESSINGS) ×2 IMPLANT
BNDG GAUZE 4.5X4.1 6PLY STRL (MISCELLANEOUS) ×2 IMPLANT
BNDG STRETCH 4X75 STRL LF (GAUZE/BANDAGES/DRESSINGS) ×2 IMPLANT
CANISTER SUCT 1200ML W/VALVE (MISCELLANEOUS) ×2 IMPLANT
COVER LIGHT HANDLE UNIVERSAL (MISCELLANEOUS) ×4 IMPLANT
CUFF TOURN SGL QUICK 18 (TOURNIQUET CUFF) ×2 IMPLANT
DRAPE FLUOR MINI C-ARM 54X84 (DRAPES) ×2 IMPLANT
DURAPREP 26ML APPLICATOR (WOUND CARE) ×2 IMPLANT
GAUZE PETRO XEROFOAM 1X8 (MISCELLANEOUS) ×4 IMPLANT
GAUZE SPONGE 4X4 12PLY STRL (GAUZE/BANDAGES/DRESSINGS) ×2 IMPLANT
GLOVE BIO SURGEON STRL SZ7.5 (GLOVE) ×2 IMPLANT
GLOVE INDICATOR 8.0 STRL GRN (GLOVE) ×2 IMPLANT
GOWN STRL REUS W/ TWL LRG LVL3 (GOWN DISPOSABLE) ×2 IMPLANT
GOWN STRL REUS W/TWL LRG LVL3 (GOWN DISPOSABLE) ×2
K-WIRE DBL END TROCAR 6X.045 (WIRE) ×4
K-WIRE DBL END TROCAR 6X.062 (WIRE)
KIT ROOM TURNOVER OR (KITS) ×2 IMPLANT
KWIRE DBL END TROCAR 6X.045 (WIRE) ×2 IMPLANT
KWIRE DBL END TROCAR 6X.062 (WIRE) IMPLANT
NS IRRIG 500ML POUR BTL (IV SOLUTION) ×2 IMPLANT
PACK EXTREMITY ARMC (MISCELLANEOUS) ×2 IMPLANT
PAD GROUND ADULT SPLIT (MISCELLANEOUS) ×2 IMPLANT
PIN BALLS 3/8 F/.045 WIRE (MISCELLANEOUS) IMPLANT
RASP SM TEAR CROSS CUT (RASP) ×2 IMPLANT
STOCKINETTE IMPERVIOUS LG (DRAPES) ×2 IMPLANT
STRAP BODY AND KNEE 60X3 (MISCELLANEOUS) ×2 IMPLANT
STRIP CLOSURE SKIN 1/4X4 (GAUZE/BANDAGES/DRESSINGS) ×4 IMPLANT
SUT ETHILON 4-0 (SUTURE)
SUT ETHILON 4-0 FS2 18XMFL BLK (SUTURE)
SUT ETHILON 5-0 FS-2 18 BLK (SUTURE) IMPLANT
SUT MNCRL 5-0+ PC-1 (SUTURE) ×1 IMPLANT
SUT MONOCRYL 5-0 (SUTURE) ×1
SUT VIC AB 2-0 SH 27 (SUTURE)
SUT VIC AB 2-0 SH 27XBRD (SUTURE) IMPLANT
SUT VIC AB 3-0 SH 27 (SUTURE)
SUT VIC AB 3-0 SH 27X BRD (SUTURE) IMPLANT
SUT VIC AB 4-0 FS2 27 (SUTURE) ×2 IMPLANT
SUTURE ETHLN 4-0 FS2 18XMF BLK (SUTURE) IMPLANT
staple 13mm arcus ×4 IMPLANT

## 2017-05-11 NOTE — Anesthesia Procedure Notes (Signed)
Anesthesia Regional Block: Popliteal block   Pre-Anesthetic Checklist: ,, timeout performed, Correct Patient, Correct Site, Correct Laterality, Correct Procedure, Correct Position, site marked, Risks and benefits discussed,  Surgical consent,  Pre-op evaluation,  At surgeon's request and post-op pain management  Laterality: Right  Prep: chloraprep       Needles:  Injection technique: Single-shot  Needle Type: Echogenic Needle     Needle Length: 9cm  Needle Gauge: 21     Additional Needles:   Procedures:,,,, ultrasound used (permanent image in chart),,,,  Narrative:  Start time: 05/11/2017 12:19 PM End time: 05/11/2017 12:29 PM Injection made incrementally with aspirations every 5 mL.  Performed by: Personally   Additional Notes: Functioning IV was confirmed and monitors applied. Ultrasound guidance: relevant anatomy identified, needle position confirmed, local anesthetic spread visualized around nerve(s)., vascular puncture avoided.  Image printed for medical record.  Negative aspiration and no paresthesias; incremental administration of local anesthetic. The patient tolerated the procedure well. Vitals signes recorded in RN notes.

## 2017-05-11 NOTE — Anesthesia Preprocedure Evaluation (Addendum)
Anesthesia Evaluation  Patient identified by MRN, date of birth, ID band Patient awake    Reviewed: Allergy & Precautions, H&P , NPO status , Patient's Chart, lab work & pertinent test results, reviewed documented beta blocker date and time   History of Anesthesia Complications (+) PONV and history of anesthetic complications  Airway Mallampati: III  TM Distance: >3 FB Neck ROM: full    Dental no notable dental hx.    Pulmonary neg pulmonary ROS, former smoker,    Pulmonary exam normal breath sounds clear to auscultation       Cardiovascular Exercise Tolerance: Good hypertension, negative cardio ROS   Rhythm:regular Rate:Normal     Neuro/Psych negative neurological ROS  negative psych ROS   GI/Hepatic negative GI ROS, Neg liver ROS,   Endo/Other  negative endocrine ROSdiabetes, Type 2  Renal/GU negative Renal ROS  negative genitourinary   Musculoskeletal   Abdominal   Peds  Hematology negative hematology ROS (+)   Anesthesia Other Findings   Reproductive/Obstetrics negative OB ROS                             Anesthesia Physical Anesthesia Plan  ASA: II  Anesthesia Plan: General and Regional   Post-op Pain Management: GA combined w/ Regional for post-op pain   Induction:   PONV Risk Score and Plan: 3 and Ondansetron, Dexamethasone, Midazolam, Scopolamine patch - Pre-op and Propofol infusion  Airway Management Planned:   Additional Equipment:   Intra-op Plan:   Post-operative Plan:   Informed Consent: I have reviewed the patients History and Physical, chart, labs and discussed the procedure including the risks, benefits and alternatives for the proposed anesthesia with the patient or authorized representative who has indicated his/her understanding and acceptance.   Dental Advisory Given  Plan Discussed with: CRNA  Anesthesia Plan Comments:        Anesthesia Quick  Evaluation

## 2017-05-11 NOTE — Anesthesia Postprocedure Evaluation (Signed)
Anesthesia Post Note  Patient: Cynthia King  Procedure(s) Performed: first metatarsal osteotomy (Right ) GASTROC RECESSION EXTREMITY (Right )  Patient location during evaluation: PACU Anesthesia Type: General Level of consciousness: awake and alert Pain management: pain level controlled Vital Signs Assessment: post-procedure vital signs reviewed and stable Respiratory status: spontaneous breathing, nonlabored ventilation, respiratory function stable and patient connected to nasal cannula oxygen Cardiovascular status: blood pressure returned to baseline and stable Postop Assessment: no apparent nausea or vomiting Anesthetic complications: no    Cynthia King

## 2017-05-11 NOTE — Progress Notes (Signed)
Assisted Cynthia King ANMD with right, ultrasound guided, popliteal/saphenous block. Side rails up, monitors on throughout procedure. See vital signs in flow sheet. Tolerated Procedure well.   

## 2017-05-11 NOTE — Anesthesia Procedure Notes (Signed)
Anesthesia Regional Block: Adductor canal block   Pre-Anesthetic Checklist: ,, timeout performed, Correct Patient, Correct Site, Correct Laterality, Correct Procedure, Correct Position, site marked, Risks and benefits discussed,  Surgical consent,  Pre-op evaluation,  At surgeon's request and post-op pain management  Laterality: Right  Prep: chloraprep       Needles:  Injection technique: Single-shot  Needle Type: Echogenic Needle     Needle Length: 9cm  Needle Gauge: 21     Additional Needles:   Procedures:,,,, ultrasound used (permanent image in chart),,,,  Narrative:  Start time: 05/11/2017 12:19 PM End time: 05/11/2017 12:29 PM Injection made incrementally with aspirations every 5 mL.  Performed by: Personally   Additional Notes: Functioning IV was confirmed and monitors applied. Ultrasound guidance: relevant anatomy identified, needle position confirmed, local anesthetic spread visualized around nerve(s)., vascular puncture avoided.  Image printed for medical record.  Negative aspiration and no paresthesias; incremental administration of local anesthetic. The patient tolerated the procedure well. Vitals signes recorded in RN notes.

## 2017-05-11 NOTE — Anesthesia Procedure Notes (Signed)
Performed by: Aeron Lheureux Pre-anesthesia Checklist: Patient identified, Emergency Drugs available, Suction available, Timeout performed and Patient being monitored Patient Re-evaluated:Patient Re-evaluated prior to induction Oxygen Delivery Method: Simple face mask Placement Confirmation: positive ETCO2       

## 2017-05-11 NOTE — Discharge Instructions (Signed)
[facility] °[facility] ° °POST OPERATIVE INSTRUCTIONS FOR DR. TROXLER AND DR. Hershall Benkert °[facility] ° ° °1. Take your medication as prescribed.  Pain medication should be taken only as needed. ° °2. Keep the dressing clean, dry and intact. ° °3. Keep your foot elevated above the heart level for the first 48 hours. ° °4. Walking to the bathroom and brief periods of walking are acceptable, unless we have instructed you to be non-weight bearing. ° °5. Always wear your post-op shoe when walking.  Always use your crutches if you are to be non-weight bearing. ° °6. Do not take a shower. Baths are permissible as long as the foot is kept out of the water.  ° °7. Every hour you are awake:  °- Bend your knee 15 times. ° ° °8. Call [facility] ([phone]) if any of the following problems occur: °- You develop a temperature or fever. °- The bandage becomes saturated with blood. °- Medication does not stop your pain. °- Injury of the foot occurs. °- Any symptoms of infection including redness, odor, or red streaks running from wound. ° ° °General Anesthesia, Adult, Care After °These instructions provide you with information about caring for yourself after your procedure. Your health care provider may also give you more specific instructions. Your treatment has been planned according to current medical practices, but problems sometimes occur. Call your health care provider if you have any problems or questions after your procedure. °What can I expect after the procedure? °After the procedure, it is common to have: °· Vomiting. °· A sore throat. °· Mental slowness. ° °It is common to feel: °· Nauseous. °· Cold or shivery. °· Sleepy. °· Tired. °· Sore or achy, even in parts of your body where you did not have surgery. ° °Follow these instructions at home: °For at least 24 hours after the procedure: °· Do not: °? Participate in activities where you could fall or become  injured. °? Drive. °? Use heavy machinery. °? Drink alcohol. °? Take sleeping pills or medicines that cause drowsiness. °? Make important decisions or sign legal documents. °? Take care of children on your own. °· Rest. °Eating and drinking °· If you vomit, drink water, juice, or soup when you can drink without vomiting. °· Drink enough fluid to keep your urine clear or pale yellow. °· Make sure you have little or no nausea before eating solid foods. °· Follow the diet recommended by your health care provider. °General instructions °· Have a responsible adult stay with you until you are awake and alert. °· Return to your normal activities as told by your health care provider. Ask your health care provider what activities are safe for you. °· Take over-the-counter and prescription medicines only as told by your health care provider. °· If you smoke, do not smoke without supervision. °· Keep all follow-up visits as told by your health care provider. This is important. °Contact a health care provider if: °· You continue to have nausea or vomiting at home, and medicines are not helpful. °· You cannot drink fluids or start eating again. °· You cannot urinate after 8-12 hours. °· You develop a skin rash. °· You have fever. °· You have increasing redness at the site of your procedure. °Get help right away if: °· You have difficulty breathing. °· You have chest pain. °· You have unexpected bleeding. °· You feel that you are having a life-threatening or urgent problem. °This information   is not intended to replace advice given to you by your health care provider. Make sure you discuss any questions you have with your health care provider. °Document Released: 10/25/2000 Document Revised: 12/22/2015 Document Reviewed: 07/03/2015 °Elsevier Interactive Patient Education © 2018 [employer] ° °

## 2017-05-11 NOTE — H&P (Signed)
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  The various methods of treatment have been discussed with the patient.  No guarantees were given.  After consideration of risks, benefits and other options for treatment, the patient has consented to surgery.  I have reviewed the patients' chart and labs.    No data found.   A history and physical examination was performed in my office.  The patient was reexamined.  There have been no changes to this history and physical examination.  Gwyneth Revels A

## 2017-05-11 NOTE — Op Note (Signed)
Operative note   Surgeon:Geoffry Bannister [employer] logistics/support/administrative officer: None    Preop diagnosis: 1. Plantar right foot first metatarsal ulceration 2. Equinus deformity right lower extremity    Postop diagnosis: Same    Procedure: 1. Dorsiflexion first metatarsal osteotomy right foot 2. Open Strayer gastroc recession right posterior leg    EBL: Minimal    Anesthesia:regional and IV sedation    Hemostasis: Ankle tourniquet inflated to 200 mmHg for approximate 70 minutes    Specimen: None    Complications: None    Operative indications:Cynthia King is an 56 y.o. that presents today for surgical intervention.  The risks/benefits/alternatives/complications have been discussed and consent has been given.    Procedure:  Patient was brought into the OR and placed on the operating table in thelateral position. After anesthesia was obtained theright lower extremity was prepped and draped in usual sterile fashion.  Attention was directed to the posterior aspect of the right calf where initially this was infiltrated with 10 cc of 1% lidocaine with epinephrine prior to prep. A longitudinal incision was made at the gastrocsoleus junction. Sharp and blunt dissection was carried down to the peritenon. The small saphenous vein and sural nerve were retracted. At this time a medial to lateral May resection was performed. This exposed the underlying muscle belly. Good excursion was noted with resection. Closure was performed after the wound was flushed with irrigation. Closure performed with a 3-0 Vicryl for the peritenon and subcutaneous tissue and a 4-0 Monocryl undyed for skin. This area was then covered.   The patient was then placed in the supine position where the sterile tourniquet was placed on the ankle and inflated. At this time a longitudinal incision was made from the base of the first metatarsal to the mid shaft of the first metatarsal. Sharp and blunt dissection was carried down to the periosteum.  Subperiosteal dissection was undertaken medial and lateral. The extensor hallucis longus tendon was retracted laterally. At this time the ensuing osteotomy was mapped with 2 converging 45 K wires then noted on AP and lateral view to be in good position. A dorsal to plantar osteotomy was then created leaving the plantar hinge intact. This was then compressed and further feathering of the bone was performed. Good dorsiflexion of the first metatarsal was noted at this time. 3 compression staples were then applied from dorsal dorsal medial and lateral on the first metatarsal. To 13 mm and one 16 mm stable while use. Good compression and alignment was noted in all planes. Gross loosening of the distal first metatarsal head was noted. The wound was flushed with copious varus or irrigation. Layered closure was then performed with 3-0 Vicryl for the deeper and subcutaneous tissues and a 4-0 Monocryl undyed for skin. The plantar first metatarsal ulceration was superficial in nature and debrided with a 15 blade down to good healthy bleeding tissue. This was excisional debridement of a 1 cm ulceration to the epidermis into the dermal layer not into subcutaneous tissue without infection. A bulky sterile dressing was applied after infiltration of the first metatarsal osteotomy with a total of 40 cc of a 50-50 mixture of 0.25% ropivacaine and Exparel long-acting Marcaine anesthetic    Patient tolerated the procedure and anesthesia well.  Was transported from the OR to the PACU with all vital signs stable and vascular status intact. To be discharged per routine protocol.  Will follow up in approximately 1 week in the outpatient clinic.

## 2017-05-11 NOTE — Transfer of Care (Signed)
Immediate Anesthesia Transfer of Care Note  Patient: Cynthia King  Procedure(s) Performed: first metatarsal osteotomy (Right ) GASTROC RECESSION EXTREMITY (Right )  Patient Location: PACU  Anesthesia Type: General, General LMA  Level of Consciousness: awake, alert  and patient cooperative  Airway and Oxygen Therapy: Patient Spontanous Breathing and Patient connected to supplemental oxygen  Post-op Assessment: Post-op Vital signs reviewed, Patient's Cardiovascular Status Stable, Respiratory Function Stable, Patent Airway and No signs of Nausea or vomiting  Post-op Vital Signs: Reviewed and stable  Complications: No apparent anesthesia complications

## 2017-05-12 ENCOUNTER — Encounter: Payer: Self-pay | Admitting: Podiatry

## 2017-07-24 IMAGING — MG MM DIGITAL SCREENING BILATERAL
1 series · 4 of 4 positions shown · non-contrast
Comparison: Previous exam(s).

CLINICAL DATA: Screening.

EXAM:
DIGITAL SCREENING BILATERAL MAMMOGRAM WITH CAD

[R CC · right · 4 of 4 slices shown]
[im 1/4]
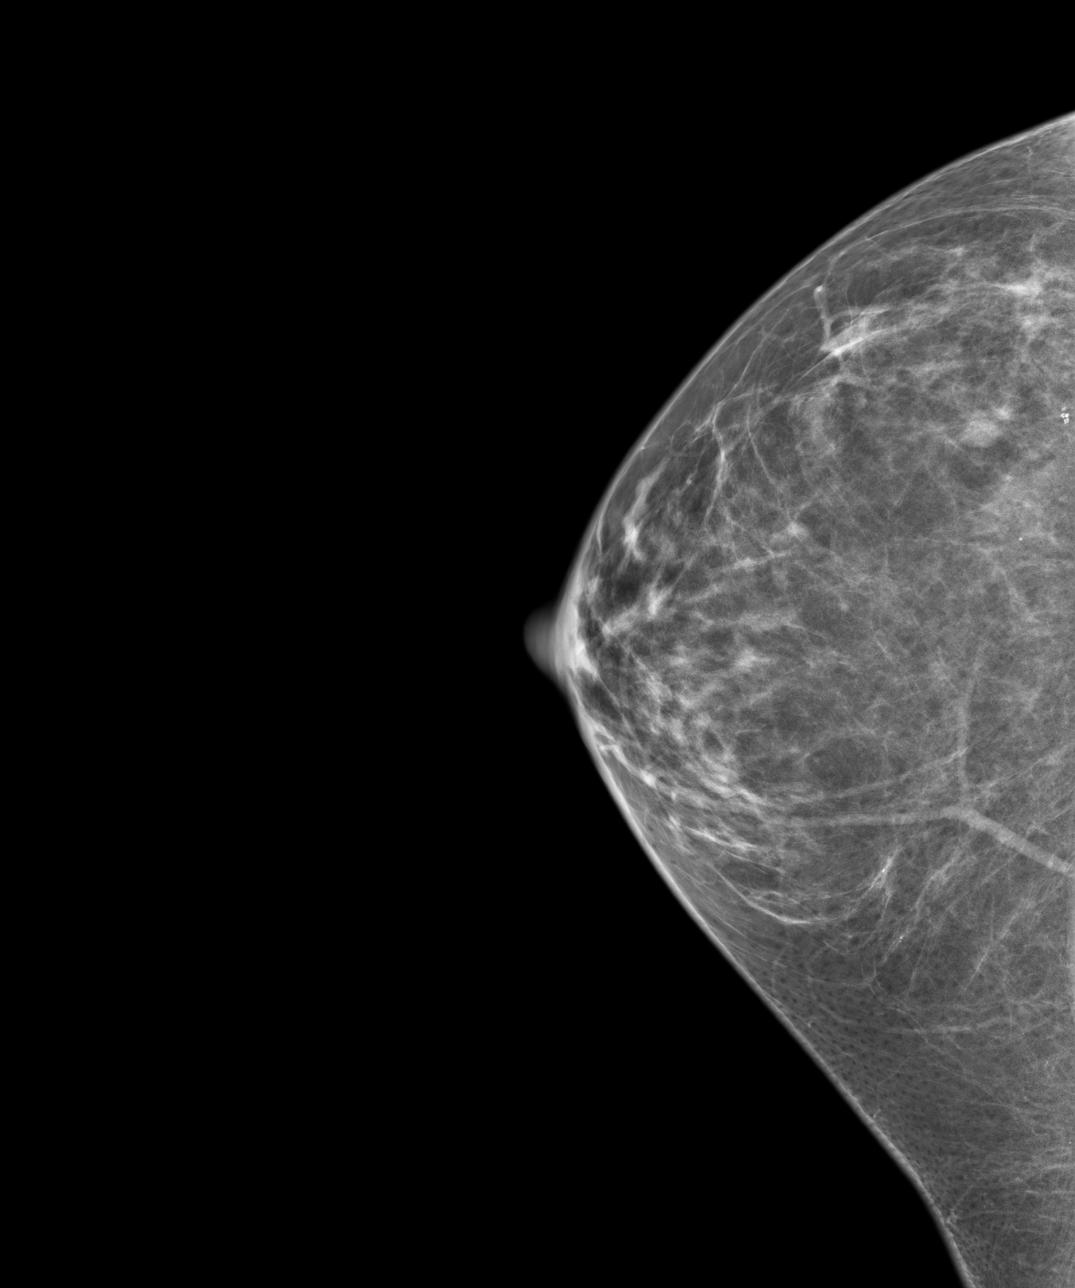
[im 2/4]
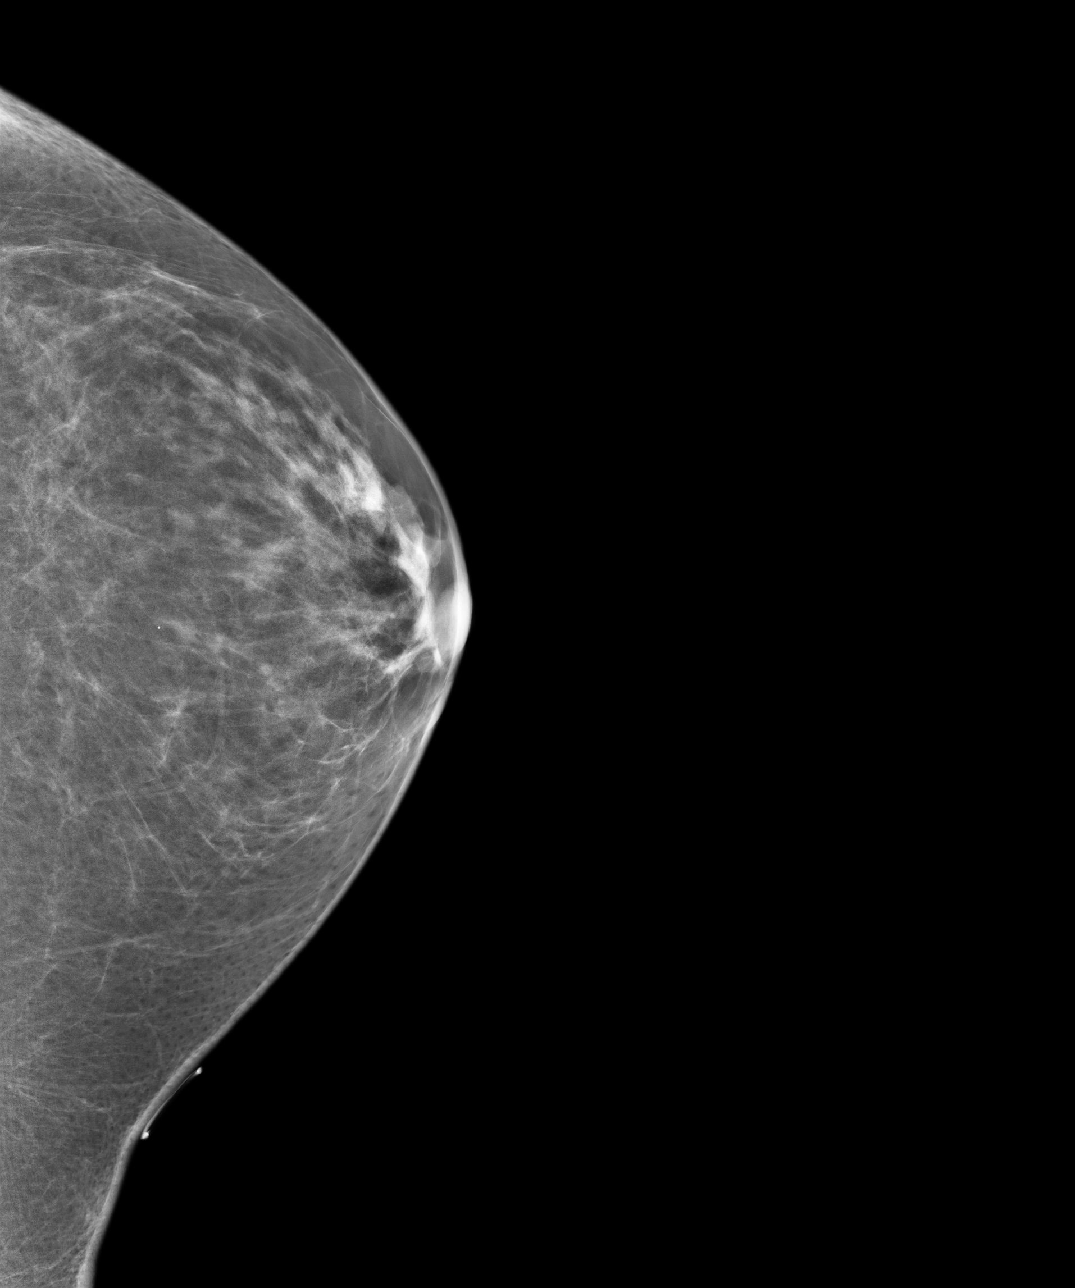
[im 3/4]
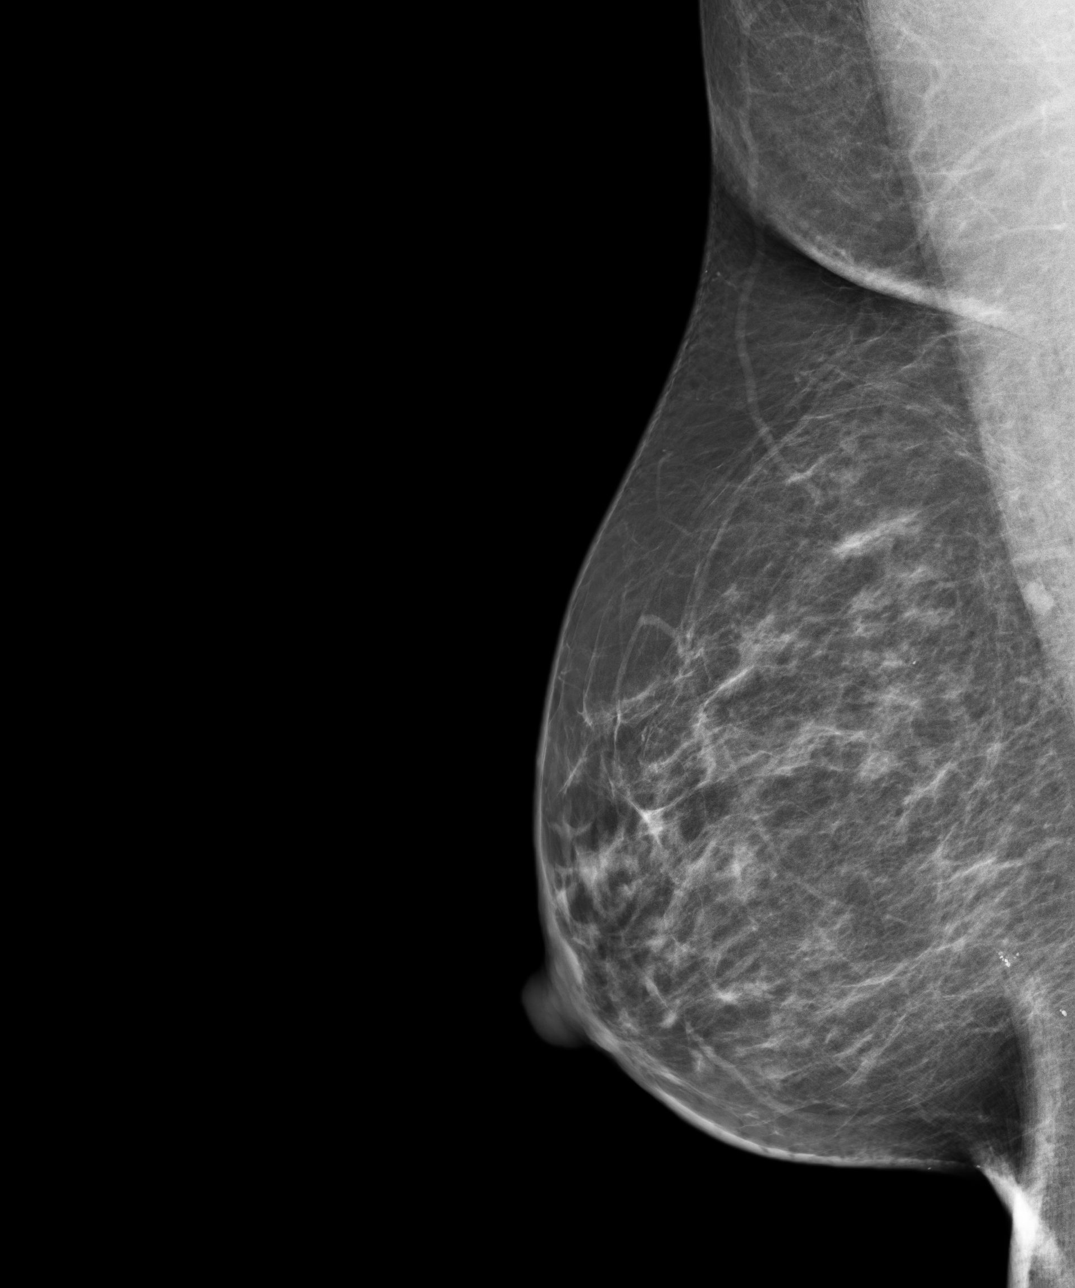
[im 4/4]
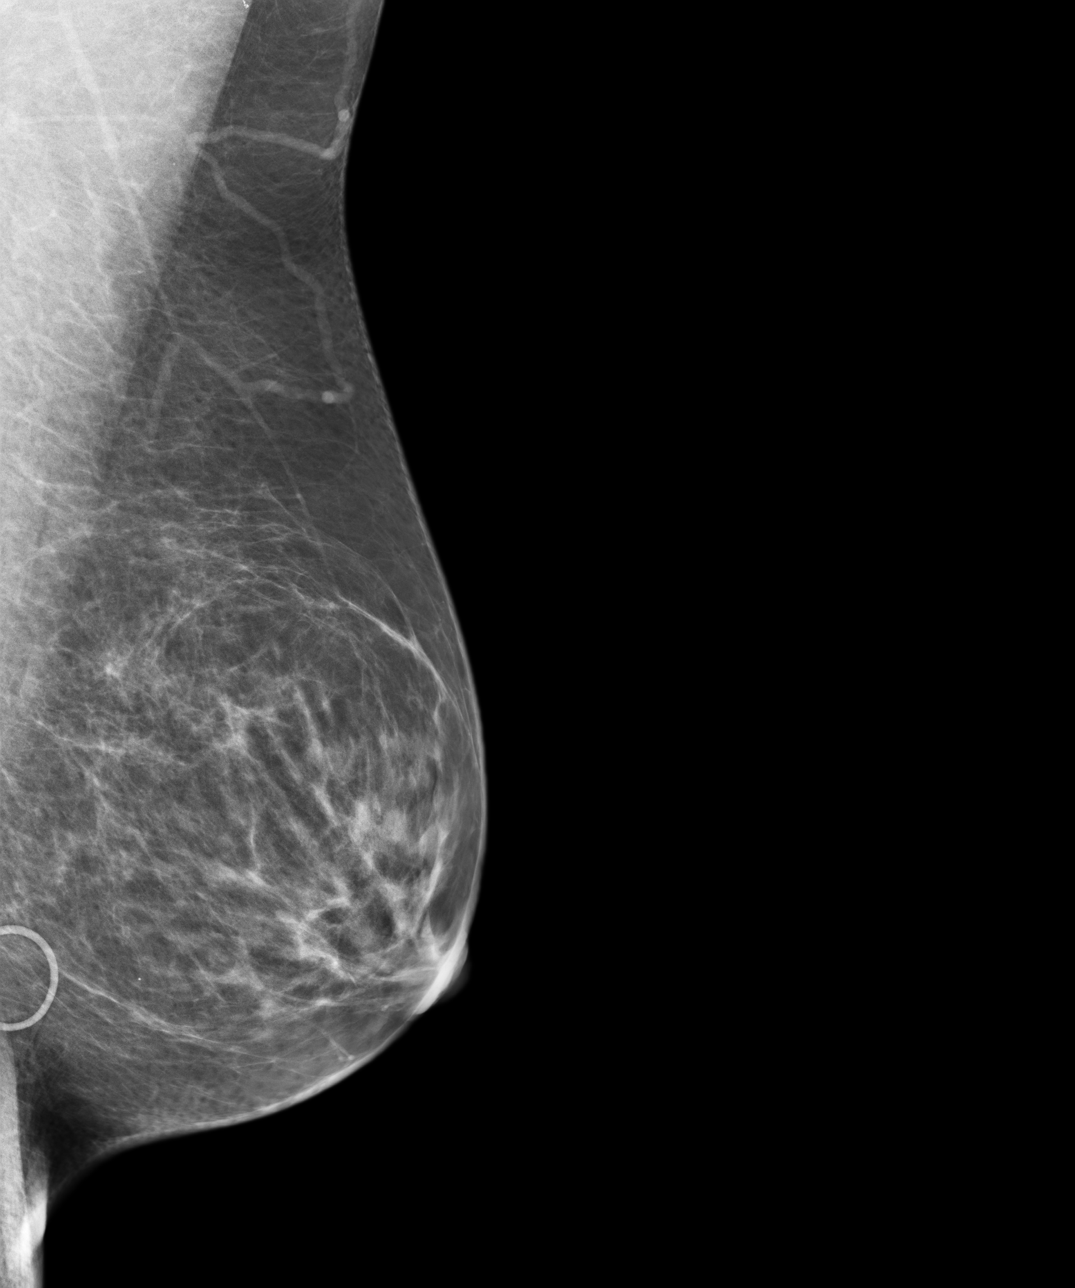

[4 of 4 positions shown; findings below may reference images not displayed]

ACR Breast Density Category b: There are scattered areas of
fibroglandular density.
FINDINGS: There are no findings suspicious for malignancy. Images were
processed with CAD.
IMPRESSION: No mammographic evidence of malignancy. A result letter of this
screening mammogram will be mailed directly to the patient.

RECOMMENDATION:
Screening mammogram in one year. (Code:AS-G-LCT)

BI-RADS CATEGORY  1: Negative.

## 2017-08-12 ENCOUNTER — Other Ambulatory Visit: Payer: Self-pay | Admitting: Obstetrics and Gynecology

## 2017-08-12 DIAGNOSIS — Z1231 Encounter for screening mammogram for malignant neoplasm of breast: Secondary | ICD-10-CM

## 2017-08-24 ENCOUNTER — Ambulatory Visit
Admission: RE | Admit: 2017-08-24 | Discharge: 2017-08-24 | Disposition: A | Discharge status: Home / Self Care | Payer: BLUE CROSS/BLUE SHIELD | Source: Ambulatory Visit | Attending: Obstetrics and Gynecology | Admitting: Obstetrics and Gynecology

## 2017-08-24 DIAGNOSIS — Z1231 Encounter for screening mammogram for malignant neoplasm of breast: Secondary | ICD-10-CM | POA: Diagnosis not present
